# Patient Record
Sex: Female | Born: 1995
Health system: Southern US, Community
[De-identification: ages and names within clinical notes are randomized; demographics above are authoritative.]

---
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
## 2015-12-15 IMAGING — CR DG CHEST 2V
2 series · 2 of 2 positions shown · non-contrast
Comparison: None.

CLINICAL DATA: Generalized body aches, cough with chest pain

EXAM:
CHEST  2 VIEW

[w chest pa]
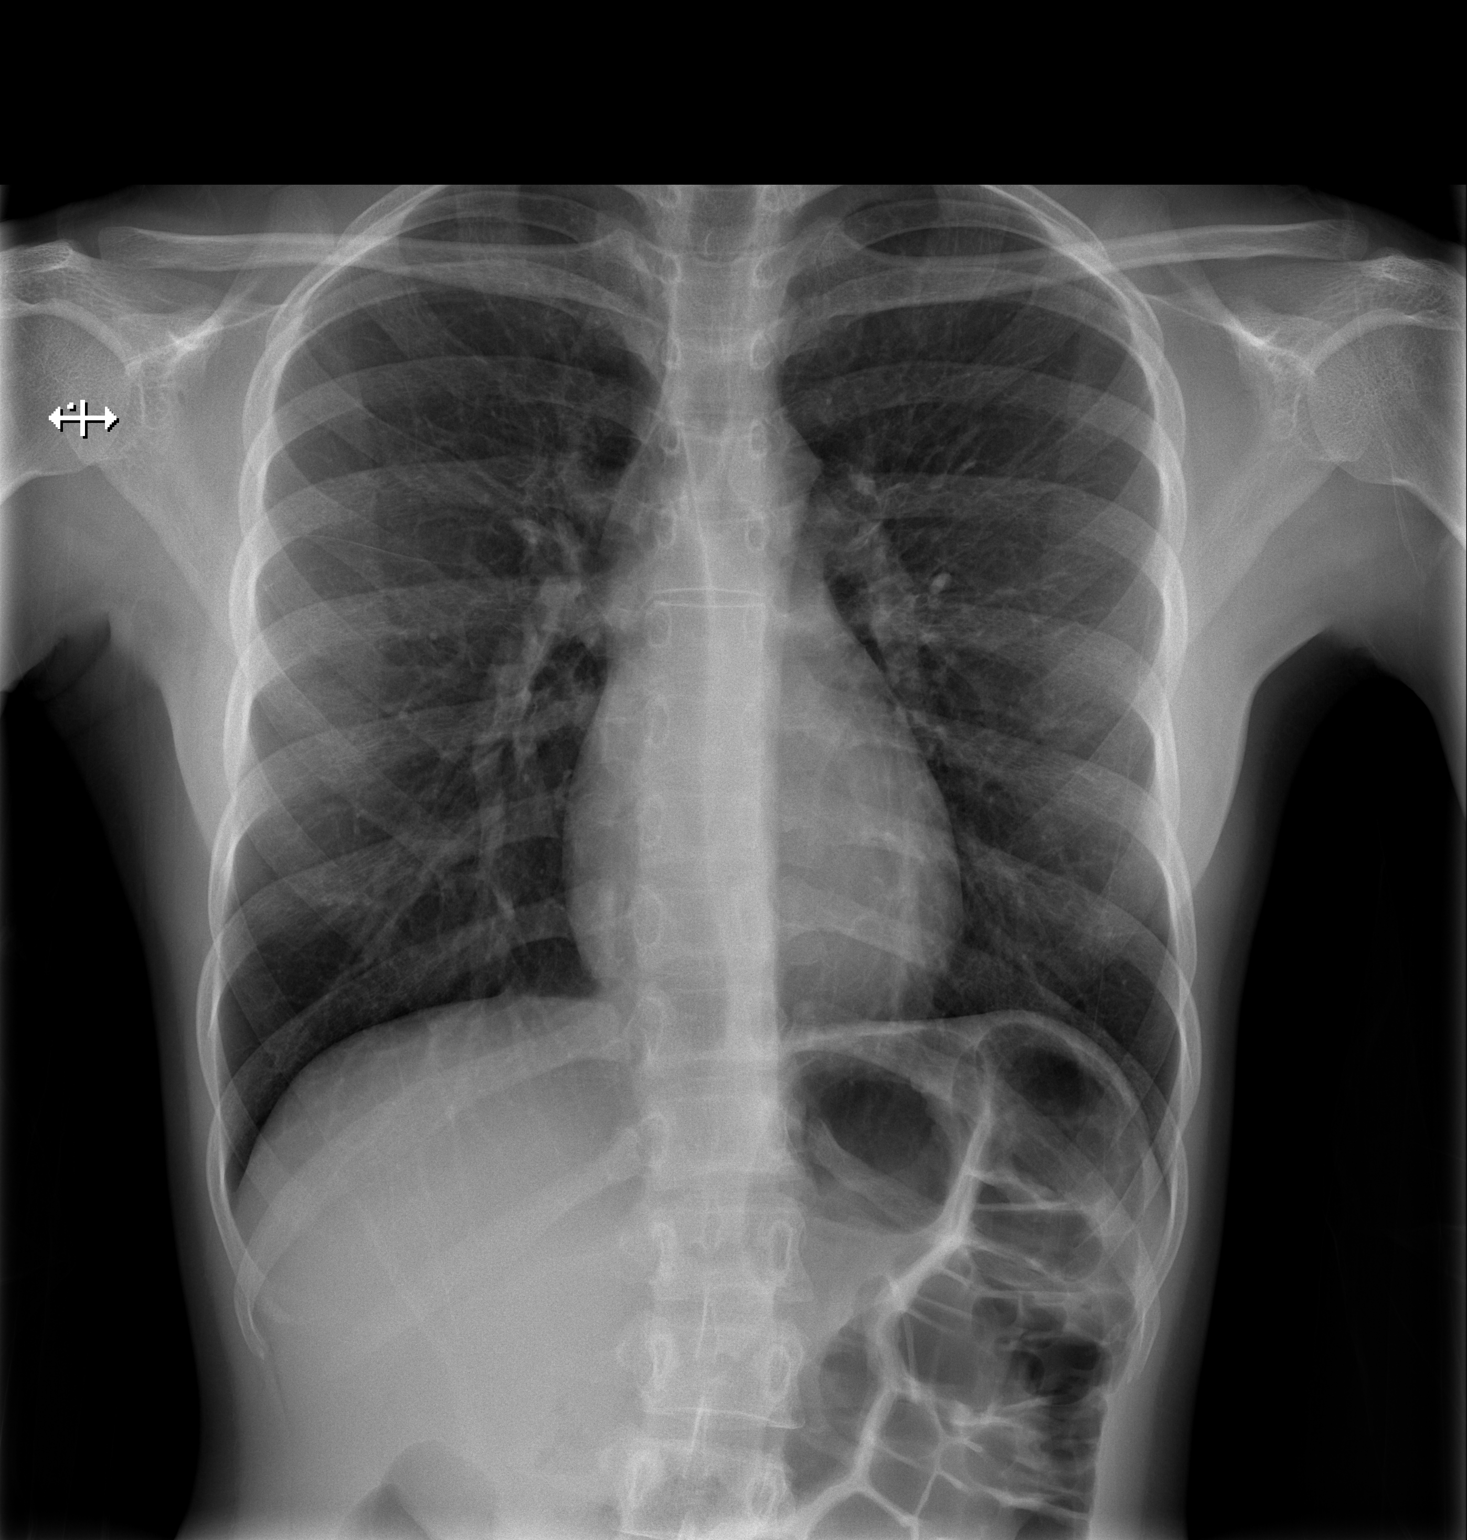

[w chest lat]
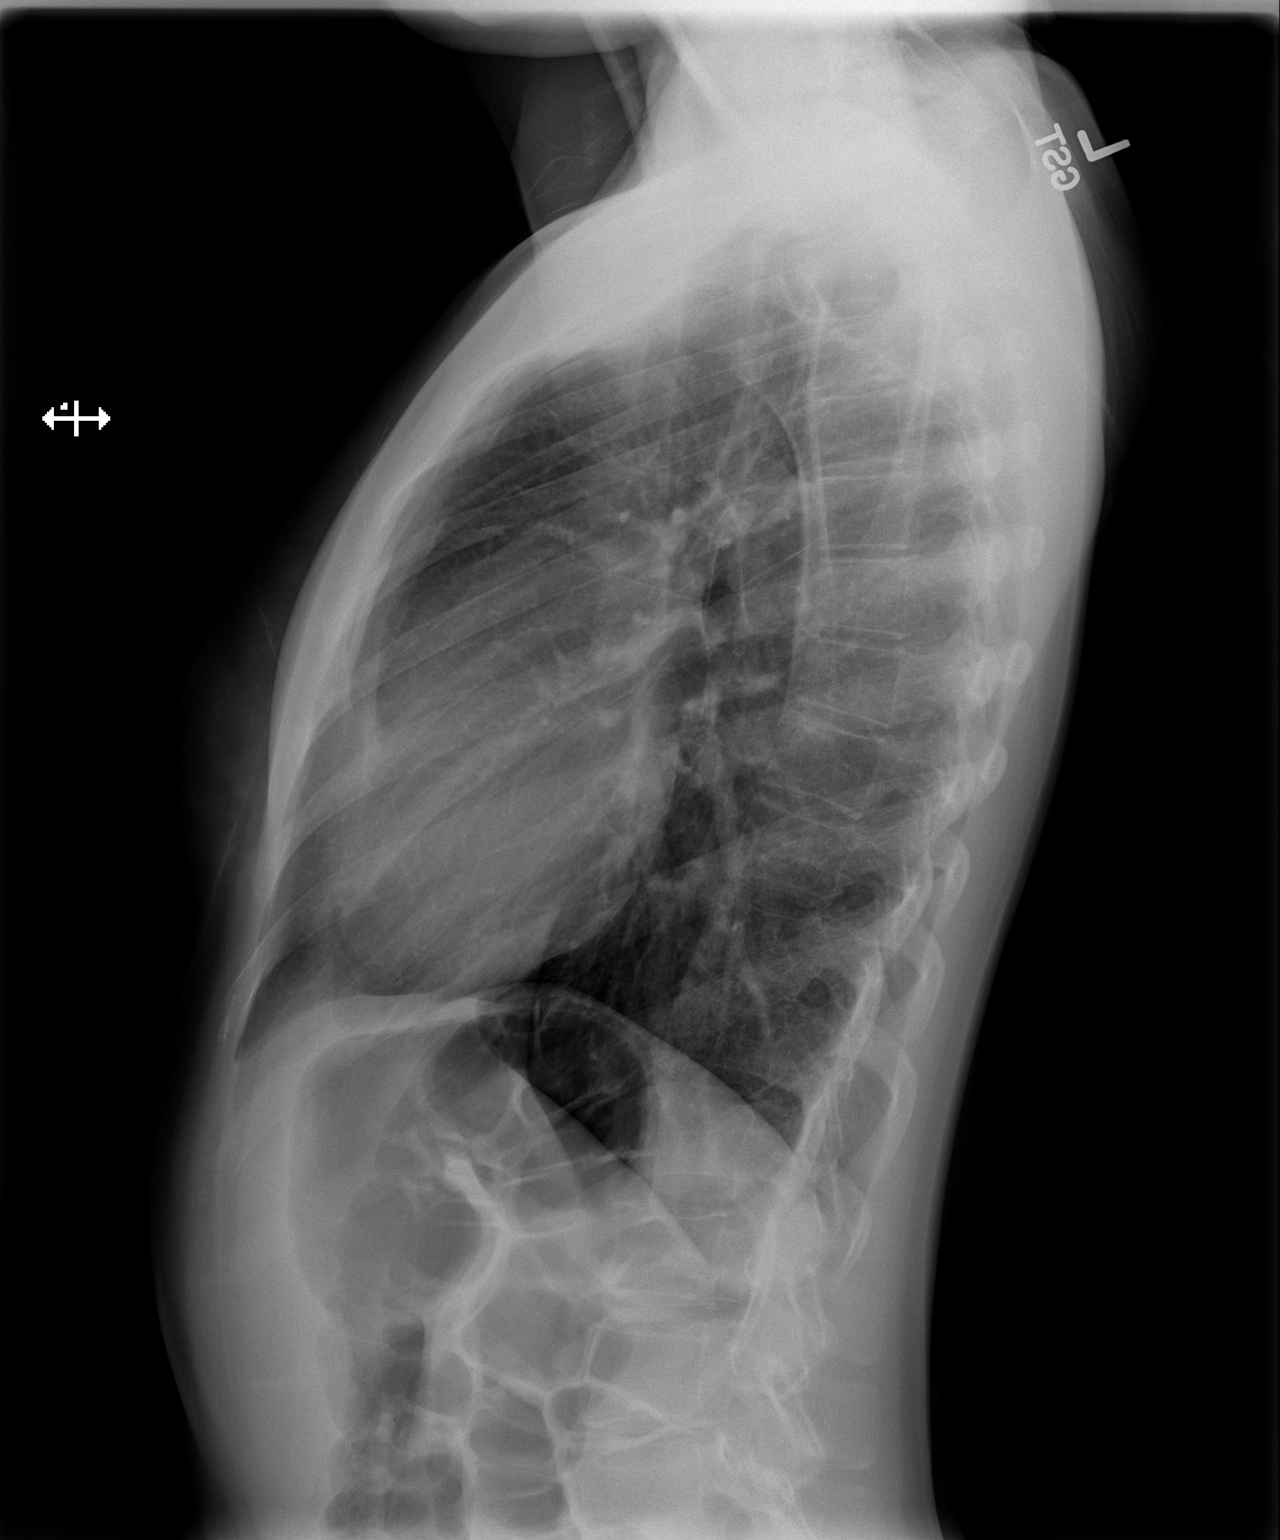

[2 of 2 positions shown; findings below may reference images not displayed]

FINDINGS: The heart size and vascular pattern are normal. Lungs are clear. No
pleural effusions. Mild sigmoid scoliotic curvature of the
thoracolumbar spine.
IMPRESSION: No active cardiopulmonary disease.

## 2019-09-17 DIAGNOSIS — Z113 Encounter for screening for infections with a predominantly sexual mode of transmission: Secondary | ICD-10-CM | POA: Diagnosis not present

## 2019-09-17 DIAGNOSIS — Z202 Contact with and (suspected) exposure to infections with a predominantly sexual mode of transmission: Secondary | ICD-10-CM | POA: Diagnosis not present

## 2020-05-13 ENCOUNTER — Other Ambulatory Visit (HOSPITAL_COMMUNITY)
Admission: RE | Admit: 2020-05-13 | Discharge: 2020-05-13 | Disposition: A | Discharge status: Home / Self Care | Payer: BC Managed Care – PPO | Source: Ambulatory Visit | Attending: Family Medicine | Admitting: Family Medicine

## 2020-05-13 ENCOUNTER — Other Ambulatory Visit: Payer: Self-pay | Admitting: Family Medicine

## 2020-05-13 DIAGNOSIS — Z124 Encounter for screening for malignant neoplasm of cervix: Secondary | ICD-10-CM | POA: Diagnosis not present

## 2020-05-13 DIAGNOSIS — R030 Elevated blood-pressure reading, without diagnosis of hypertension: Secondary | ICD-10-CM | POA: Diagnosis not present

## 2020-05-13 DIAGNOSIS — Z01411 Encounter for gynecological examination (general) (routine) with abnormal findings: Secondary | ICD-10-CM | POA: Diagnosis not present

## 2020-05-13 DIAGNOSIS — Z0000 Encounter for general adult medical examination without abnormal findings: Secondary | ICD-10-CM | POA: Diagnosis not present

## 2020-05-13 DIAGNOSIS — Z1159 Encounter for screening for other viral diseases: Secondary | ICD-10-CM | POA: Diagnosis not present

## 2020-05-16 LAB — CYTOLOGY - PAP
Comment: NEGATIVE
High risk HPV: NEGATIVE

## 2020-07-04 DIAGNOSIS — D649 Anemia, unspecified: Secondary | ICD-10-CM | POA: Diagnosis not present
